# Patient Record
Sex: Male | Born: 1970 | Race: Black or African American | Hispanic: No | Marital: Married
Health system: Southern US, Community
[De-identification: ages and names within clinical notes are randomized; demographics above are authoritative.]

---

## 2000-02-12 ENCOUNTER — Emergency Department (HOSPITAL_COMMUNITY): Admission: EM | Admit: 2000-02-12 | Discharge: 2000-02-12 | Payer: Self-pay | Admitting: *Deleted

## 2000-07-06 ENCOUNTER — Emergency Department (HOSPITAL_COMMUNITY): Admission: EM | Admit: 2000-07-06 | Discharge: 2000-07-06 | Payer: Self-pay | Admitting: *Deleted

## 2000-07-06 ENCOUNTER — Encounter: Payer: Self-pay | Admitting: Emergency Medicine

## 2001-08-17 ENCOUNTER — Emergency Department (HOSPITAL_COMMUNITY): Admission: EM | Admit: 2001-08-17 | Discharge: 2001-08-17 | Payer: Self-pay | Admitting: Emergency Medicine

## 2002-06-04 ENCOUNTER — Emergency Department (HOSPITAL_COMMUNITY): Admission: EM | Admit: 2002-06-04 | Discharge: 2002-06-04 | Payer: Self-pay | Admitting: *Deleted

## 2003-07-19 ENCOUNTER — Encounter: Payer: Self-pay | Admitting: Emergency Medicine

## 2003-07-19 ENCOUNTER — Inpatient Hospital Stay (HOSPITAL_COMMUNITY): Admission: AC | Admit: 2003-07-19 | Discharge: 2003-08-12 | Payer: Self-pay

## 2003-07-24 ENCOUNTER — Encounter: Payer: Self-pay | Admitting: General Surgery

## 2003-07-25 ENCOUNTER — Encounter: Payer: Self-pay | Admitting: General Surgery

## 2003-07-26 ENCOUNTER — Encounter: Payer: Self-pay | Admitting: General Surgery

## 2003-07-27 ENCOUNTER — Encounter: Payer: Self-pay | Admitting: General Surgery

## 2003-07-28 ENCOUNTER — Encounter (INDEPENDENT_AMBULATORY_CARE_PROVIDER_SITE_OTHER): Payer: Self-pay | Admitting: Cardiology

## 2003-07-30 ENCOUNTER — Encounter: Payer: Self-pay | Admitting: General Surgery

## 2003-07-31 ENCOUNTER — Encounter (INDEPENDENT_AMBULATORY_CARE_PROVIDER_SITE_OTHER): Payer: Self-pay | Admitting: *Deleted

## 2003-07-31 ENCOUNTER — Encounter (INDEPENDENT_AMBULATORY_CARE_PROVIDER_SITE_OTHER): Payer: Self-pay | Admitting: Specialist

## 2003-08-01 ENCOUNTER — Encounter: Payer: Self-pay | Admitting: Thoracic Surgery

## 2003-08-02 ENCOUNTER — Encounter: Payer: Self-pay | Admitting: General Surgery

## 2003-08-03 ENCOUNTER — Encounter: Payer: Self-pay | Admitting: General Surgery

## 2003-08-04 ENCOUNTER — Encounter: Payer: Self-pay | Admitting: General Surgery

## 2003-08-05 ENCOUNTER — Encounter: Payer: Self-pay | Admitting: Thoracic Surgery

## 2003-08-06 ENCOUNTER — Encounter: Payer: Self-pay | Admitting: Thoracic Surgery

## 2003-08-07 ENCOUNTER — Encounter: Payer: Self-pay | Admitting: Thoracic Surgery

## 2003-08-19 ENCOUNTER — Encounter: Payer: Self-pay | Admitting: Thoracic Surgery

## 2003-08-19 ENCOUNTER — Encounter: Admission: RE | Admit: 2003-08-19 | Discharge: 2003-08-19 | Payer: Self-pay | Admitting: Thoracic Surgery

## 2003-08-29 ENCOUNTER — Emergency Department (HOSPITAL_COMMUNITY): Admission: AC | Admit: 2003-08-29 | Discharge: 2003-08-29 | Payer: Self-pay

## 2003-09-09 ENCOUNTER — Encounter: Admission: RE | Admit: 2003-09-09 | Discharge: 2003-09-09 | Payer: Self-pay | Admitting: Thoracic Surgery

## 2003-11-28 ENCOUNTER — Emergency Department (HOSPITAL_COMMUNITY): Admission: EM | Admit: 2003-11-28 | Discharge: 2003-11-28 | Payer: Self-pay | Admitting: Emergency Medicine

## 2004-04-16 ENCOUNTER — Emergency Department (HOSPITAL_COMMUNITY): Admission: EM | Admit: 2004-04-16 | Discharge: 2004-04-16 | Payer: Self-pay | Admitting: Emergency Medicine

## 2004-09-15 ENCOUNTER — Emergency Department (HOSPITAL_COMMUNITY): Admission: EM | Admit: 2004-09-15 | Discharge: 2004-09-15 | Payer: Self-pay | Admitting: Emergency Medicine

## 2007-04-07 ENCOUNTER — Emergency Department (HOSPITAL_COMMUNITY): Admission: EM | Admit: 2007-04-07 | Discharge: 2007-04-07 | Payer: Self-pay | Admitting: Emergency Medicine

## 2007-12-20 ENCOUNTER — Emergency Department (HOSPITAL_COMMUNITY): Admission: EM | Admit: 2007-12-20 | Discharge: 2007-12-20 | Payer: Self-pay | Admitting: Emergency Medicine

## 2010-11-19 ENCOUNTER — Encounter: Payer: Self-pay | Admitting: Thoracic Surgery
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
## 2011-08-17 LAB — POCT URINALYSIS DIP (DEVICE)
Nitrite: NEGATIVE
Protein, ur: 30 — AB
pH: 6.5

## 2016-10-12 ENCOUNTER — Other Ambulatory Visit: Payer: Self-pay | Admitting: Physical Medicine and Rehabilitation

## 2016-10-12 DIAGNOSIS — M545 Low back pain, unspecified: Secondary | ICD-10-CM

## 2016-10-12 DIAGNOSIS — G8929 Other chronic pain: Secondary | ICD-10-CM

## 2016-10-13 ENCOUNTER — Ambulatory Visit
Admission: RE | Admit: 2016-10-13 | Discharge: 2016-10-13 | Disposition: A | Discharge status: Home / Self Care | Payer: BLUE CROSS/BLUE SHIELD | Source: Ambulatory Visit | Attending: Physical Medicine and Rehabilitation | Admitting: Physical Medicine and Rehabilitation

## 2016-10-13 DIAGNOSIS — M545 Low back pain: Principal | ICD-10-CM

## 2016-10-13 DIAGNOSIS — G8929 Other chronic pain: Secondary | ICD-10-CM

## 2017-12-22 IMAGING — MR MR LUMBAR SPINE W/O CM
5 series · 43 of 48 positions shown · non-contrast
Comparison: Lumbar spine radiographs 08/24/2016

CLINICAL DATA: Low back pain and right leg/ calf pain for 3 months.

EXAM:
MRI LUMBAR SPINE WITHOUT CONTRAST
TECHNIQUE: Multiplanar, multisequence MR imaging of the lumbar spine was
performed. No intravenous contrast was administered.

[Series 3: tirm sag · sagittal · 4.0mm · 0.55mm/px · 6 of 13 slices shown]
[im 1/13]
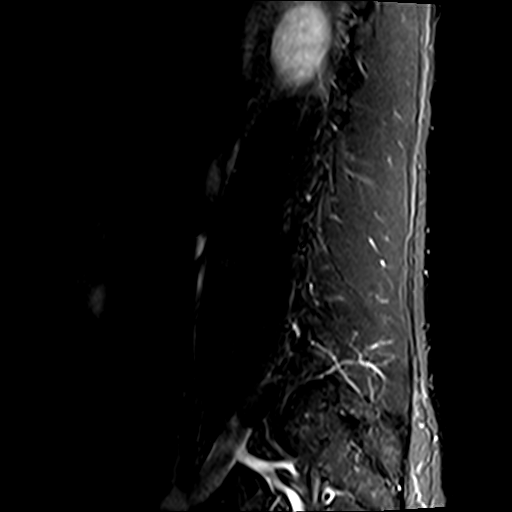
[im 3/13]
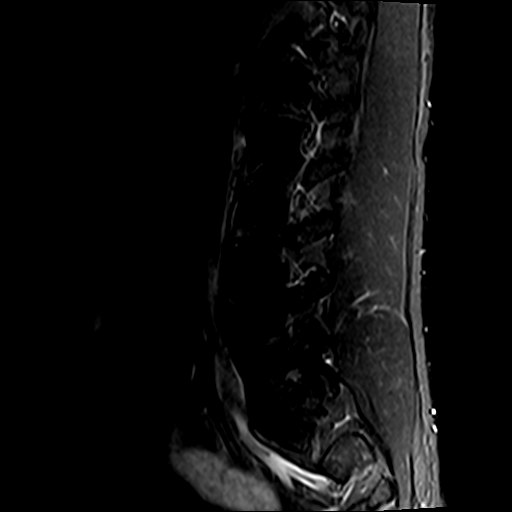
[im 5/13]
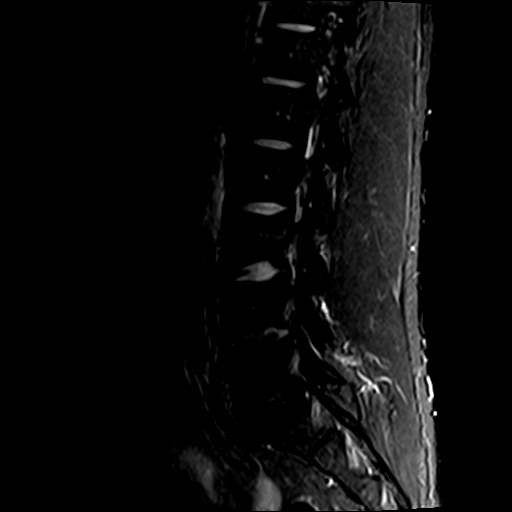
[im 8/13]
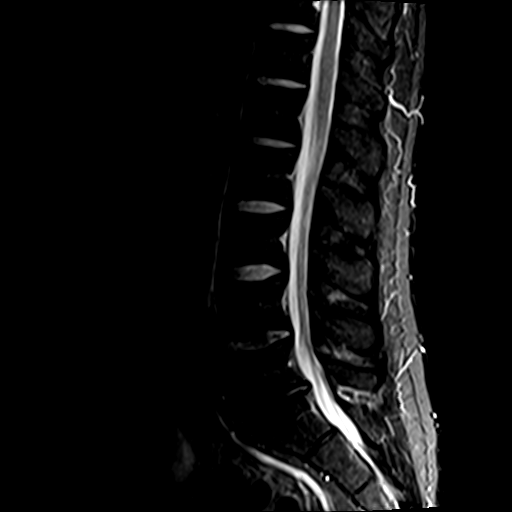
[im 10/13]
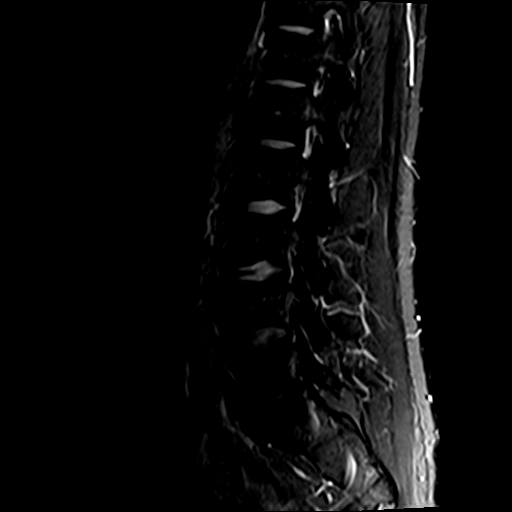
[im 13/13]
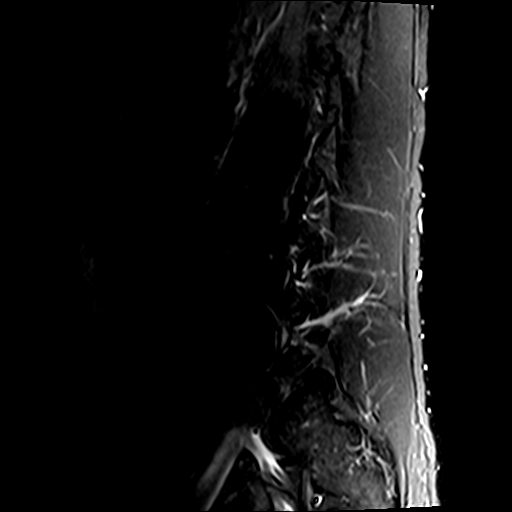

[Series 4: T2 · sagittal · 4.0mm · 0.88mm/px · 6 of 13 slices shown (1 of 2)]
[im 1/13]
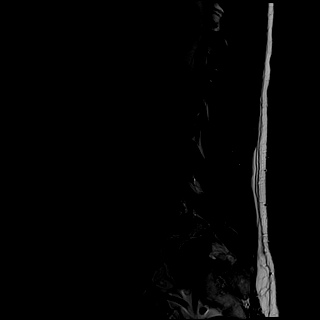
[im 3/13]
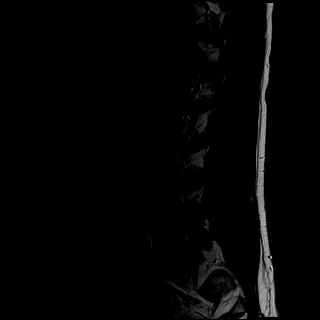
[im 5/13]
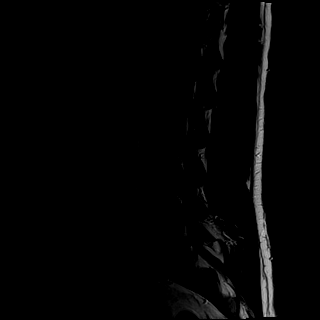
[im 8/13]
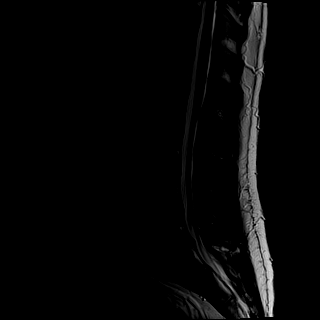
[im 10/13]
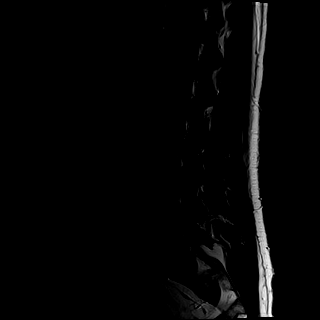
[im 13/13]
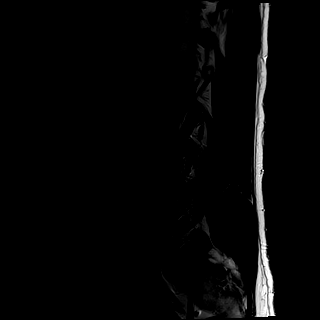

[Series 5: T1 · sagittal · 4.0mm · 0.88mm/px · 6 of 13 slices shown (1 of 2)]
[im 1/13]
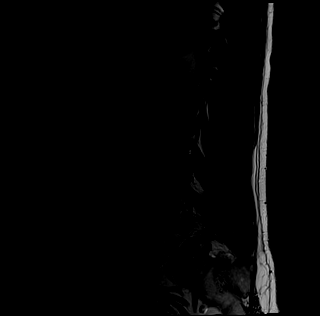
[im 3/13]
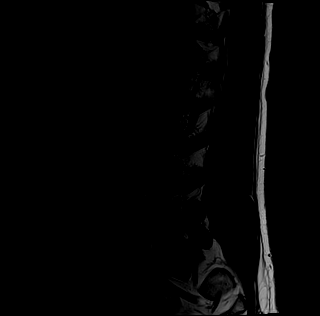
[im 5/13]
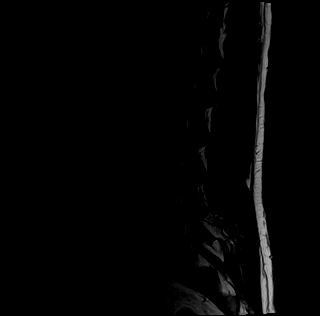
[im 8/13]
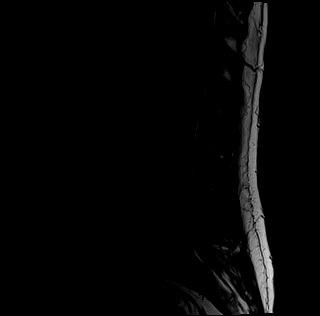
[im 10/13]
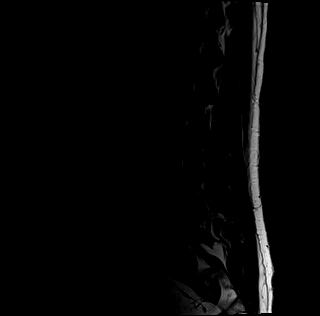
[im 13/13]
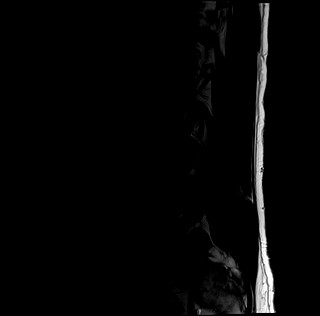

[Series 7: T1 · axial · 4.0mm · 0.78mm/px · z∈[-57,+129]mm · 10 of 33 slices shown (2 of 2)]
[im 1/33]
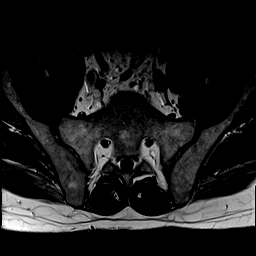
[im 3/33]
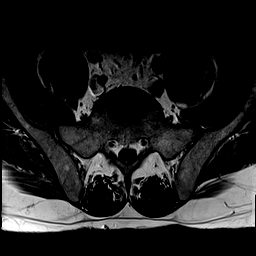
[im 5/33]
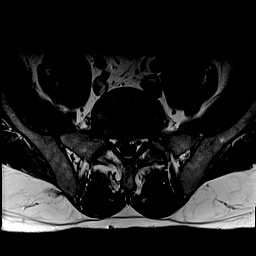
[im 10/33]
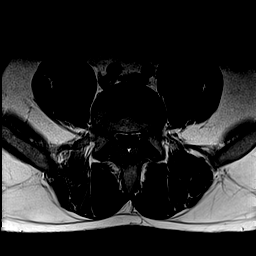
[im 14/33]
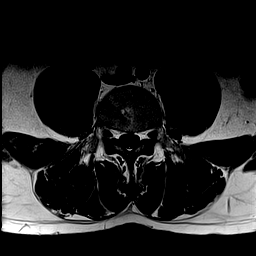
[im 17/33]
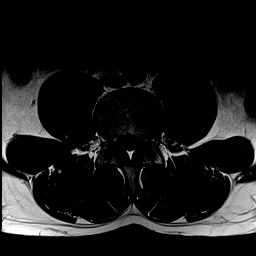
[im 19/33]
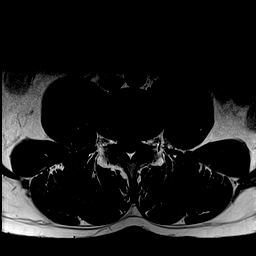
[im 23/33]
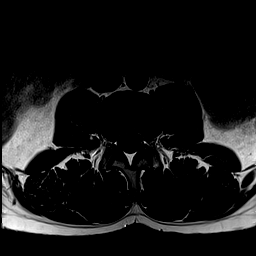
[im 28/33]
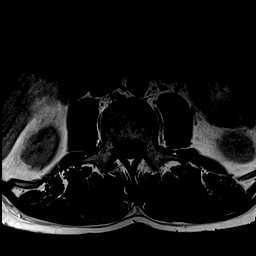
[im 33/33]
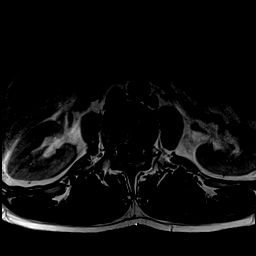

[Series 8: T2 · axial · 4.0mm · 0.78mm/px · z∈[-57,+129]mm · 15 of 33 slices shown (2 of 2)]
[im 1/33]
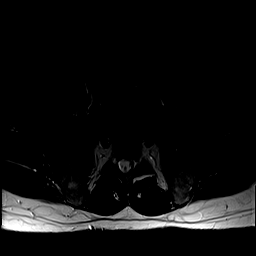
[im 3/33]
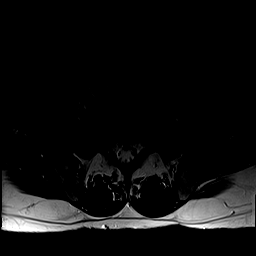
[im 5/33]
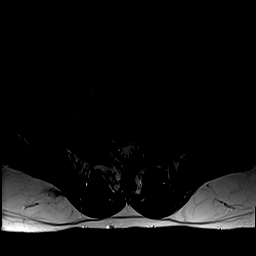
[im 7/33]
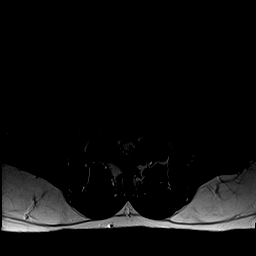
[im 10/33]
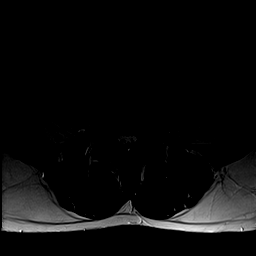
[im 12/33]
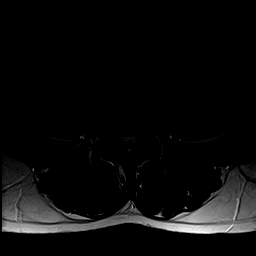
[im 14/33]
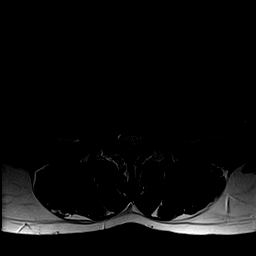
[im 17/33]
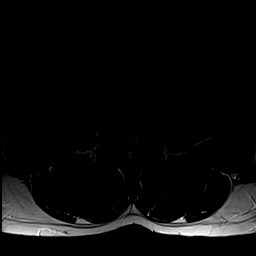
[im 19/33]
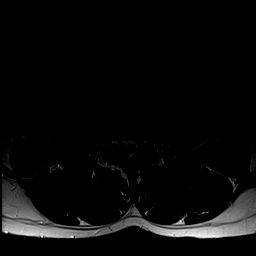
[im 21/33]
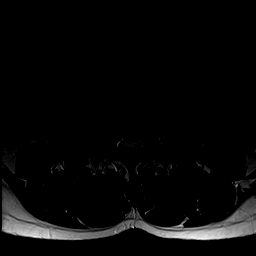
[im 23/33]
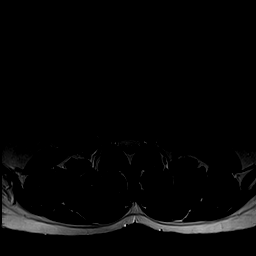
[im 26/33]
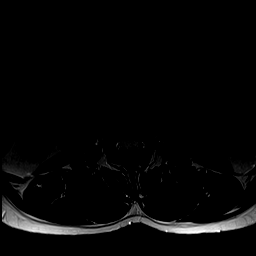
[im 28/33]
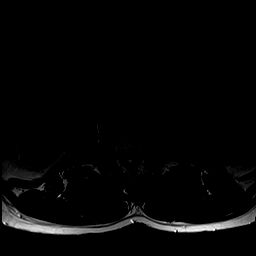
[im 30/33]
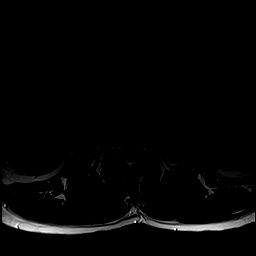
[im 33/33]
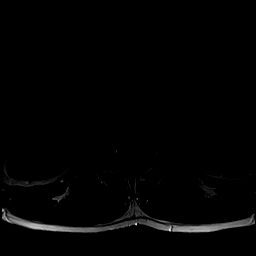

[43 of 48 positions shown; findings below may reference images not displayed]

FINDINGS: Segmentation:  Standard.

Alignment:  Normal.

Vertebrae: No evidence of fracture, osseous lesion, or significant
marrow edema. Small L4 inferior endplate Schmorl's node.

Conus medullaris: Extends to the L1-2 level and appears normal.

Paraspinal and other soft tissues: Unremarkable.

Disc levels:

L1-2 and L2-3:  Negative.

L3-4: Preserved disc height and hydration. Minimal disc bulging,
mild facet and ligamentum flavum hypertrophy, and congenitally short
pedicles result in mild bilateral lateral recess stenosis without
spinal or neural foraminal stenosis.

L4-5: Mild disc desiccation with preserved disc space height.
Minimal disc bulging, mild facet and ligamentum flavum hypertrophy,
and congenitally short pedicles result in mild bilateral lateral
recess stenosis without significant spinal or neural foraminal
stenosis.

L5-S1: Disc desiccation with minimal disc space narrowing. Disc
bulging, superimposed small right paracentral disc protrusion, and
congenitally short pedicles result in moderate right and
mild-to-moderate left lateral recess stenosis and mild bilateral
neural foraminal stenosis. The disc protrusion contacts and
displaces the right S1 nerve roots.
IMPRESSION: 1. L5-S1 disc protrusion with likely right S1 nerve root impingement
in the lateral recess.
2. Mild bilateral neural foraminal stenosis at L5-S1.
3. Mild bilateral lateral recess stenosis at L3-4 and L4-5 due to
congenitally short pedicles and posterior element hypertrophy.

## 2018-04-09 ENCOUNTER — Other Ambulatory Visit: Payer: Self-pay | Admitting: Sports Medicine

## 2018-04-09 DIAGNOSIS — G8929 Other chronic pain: Secondary | ICD-10-CM

## 2018-04-09 DIAGNOSIS — M545 Low back pain: Principal | ICD-10-CM

## 2018-04-18 ENCOUNTER — Inpatient Hospital Stay
Admission: RE | Admit: 2018-04-18 | Discharge: 2018-04-18 | Disposition: A | Discharge status: Home / Self Care | Payer: BLUE CROSS/BLUE SHIELD | Source: Ambulatory Visit | Attending: Sports Medicine | Admitting: Sports Medicine

## 2020-02-19 ENCOUNTER — Ambulatory Visit: Payer: Self-pay | Attending: Internal Medicine

## 2020-02-19 DIAGNOSIS — Z23 Encounter for immunization: Secondary | ICD-10-CM

## 2020-02-19 NOTE — Progress Notes (Signed)
   Covid-19 Vaccination Clinic  Name:  Omar Underwood    MRN: [id number] DOB: 1971/04/12  02/19/2020  Mr. Omar Underwood was observed post Covid-19 immunization for 15 minutes without incident. He was provided with Vaccine Information Sheet and instruction to access the V-Safe system.   Mr. Omar Underwood was instructed to call 911 with any severe reactions post vaccine: Marland Kitchen Difficulty breathing  . Swelling of face and throat  . A fast heartbeat  . A bad rash all over body  . Dizziness and weakness   Immunizations Administered    Name Date Dose VIS Date Route   Pfizer COVID-19 Vaccine 02/19/2020 10:05 AM 0.3 mL 12/24/2018 Intramuscular   Manufacturer: [employer], [employer]   Lot: [id number]   NDC: [address]-3

## 2020-03-15 ENCOUNTER — Ambulatory Visit: Payer: Self-pay | Attending: Internal Medicine

## 2020-03-15 DIAGNOSIS — Z23 Encounter for immunization: Secondary | ICD-10-CM

## 2020-03-15 NOTE — Progress Notes (Signed)
   Covid-19 Vaccination Clinic  Name:  Omar Underwood    MRN: [id number] DOB: 03-12-1971  03/15/2020  [name] was observed post Covid-19 immunization for 15 minutes without incident. He was provided with Vaccine Information Sheet and instruction to access the V-Safe system.   [name] was instructed to call 911 with any severe reactions post vaccine: Marland Kitchen Difficulty breathing  . Swelling of face and throat  . A fast heartbeat  . A bad rash all over body  . Dizziness and weakness   Immunizations Administered    Name Date Dose VIS Date Route   Pfizer COVID-19 Vaccine 03/15/2020 11:34 AM 0.3 mL 12/24/2018 Intramuscular   Manufacturer: [employer], [employer]   Lot: [id number]   NDC: [address]-2
